# Patient Record
Sex: Female | Born: 1995
Health system: Southern US, Community
[De-identification: ages and names within clinical notes are randomized; demographics above are authoritative.]

## PROBLEM LIST (undated history)

## (undated) DIAGNOSIS — Z789 Other specified health status: Secondary | ICD-10-CM

## (undated) HISTORY — PX: TONSILLECTOMY: SUR1361

## (undated) HISTORY — DX: Other specified health status: Z78.9

---
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
## 2016-07-15 ENCOUNTER — Encounter (HOSPITAL_COMMUNITY): Payer: Self-pay | Admitting: Emergency Medicine

## 2016-07-15 ENCOUNTER — Ambulatory Visit (HOSPITAL_COMMUNITY)
Admission: EM | Admit: 2016-07-15 | Discharge: 2016-07-15 | Disposition: A | Discharge status: Home / Self Care | Payer: BLUE CROSS/BLUE SHIELD | Attending: Family Medicine | Admitting: Family Medicine

## 2016-07-15 ENCOUNTER — Ambulatory Visit (INDEPENDENT_AMBULATORY_CARE_PROVIDER_SITE_OTHER): Payer: BLUE CROSS/BLUE SHIELD

## 2016-07-15 DIAGNOSIS — S93402A Sprain of unspecified ligament of left ankle, initial encounter: Secondary | ICD-10-CM | POA: Diagnosis not present

## 2016-07-15 NOTE — ED Provider Notes (Signed)
CSN: [id number]     Arrival date & time 07/15/16  1200 History   First MD Initiated Contact with Patient 07/15/16 1416     Chief Complaint  Patient presents with  . Ankle Pain   (Consider location/radiation/quality/duration/timing/severity/associated sxs/prior Treatment) HPI History obtained from patient:  Pt presents with the cc of:  Left ankle injury Duration of symptoms: Since Sunday Treatment prior to arrival: Cold compresses elevation and rest Context: Patient was walking and tripped and twisted her left ankle. Other symptoms include: Abrasion to the right knee Pain score: 3 FAMILY HISTORY: None    History reviewed. No pertinent past medical history. History reviewed. No pertinent past surgical history. No family history on file. Social History  Substance Use Topics  . Smoking status: Never Smoker   . Smokeless tobacco: None  . Alcohol Use: No   OB History    No data available     Review of Systems  Denies: HEADACHE, NAUSEA, ABDOMINAL PAIN, CHEST PAIN, CONGESTION, DYSURIA, SHORTNESS OF BREATH  Allergies  Sulfa antibiotics  Home Medications   Prior to Admission medications   Not on File   Meds Ordered and Administered this Visit  Medications - No data to display  BP 133/71 mmHg  Pulse 78  Temp(Src) 98.8 F (37.1 C) (Oral)  Resp 16  SpO2 100%  LMP  (LMP Unknown) No data found.   Physical Exam NURSES NOTES AND VITAL SIGNS REVIEWED. CONSTITUTIONAL: Well developed, well nourished, no acute distress HEENT: normocephalic, atraumatic EYES: Conjunctiva normal NECK:normal ROM, supple, no adenopathy PULMONARY:No respiratory distress, normal effort ABDOMINAL: Soft, ND, NT BS+, No CVAT MUSCULOSKELETAL: Normal ROM of all extremities, Left ankle, swollen, tender lateral. Right Knee: abrasion with some redness. FROM SKIN: warm and dry without rash PSYCHIATRIC: Mood and affect, behavior are normal  ED Course  Procedures (including critical care time)  Labs  Review Labs Reviewed - No data to display  Imaging Review Dg Ankle Complete Left  07/15/2016  CLINICAL DATA:  Fall down 2 steps 2 days ago injuring left foot and ankle. EXAM: LEFT ANKLE COMPLETE - 3+ VIEW COMPARISON:  None. FINDINGS: Diffuse soft tissue swelling over the ankle. Ankle mortise is within normal. No acute fracture or dislocation visualized. IMPRESSION: No acute fracture. Electronically Signed   By: Elberta Fortisaniel  Boyle M.D.   On: 07/15/2016 14:38     Visual Acuity Review  Right Eye Distance:   Left Eye Distance:   Bilateral Distance:    Right Eye Near:   Left Eye Near:    Bilateral Near:       ASO is applied,  Discussed results of x-ray D/c home in stable condition   MDM   1. Left ankle sprain, initial encounter     Patient is reassured that there are no issues that require transfer to higher level of care at this time or additional tests. Patient is advised to continue home symptomatic treatment. Patient is advised that if there are new or worsening symptoms to attend the emergency department, contact primary care provider, or return to UC. Instructions of care provided discharged home in stable condition.    THIS NOTE WAS GENERATED USING A VOICE RECOGNITION SOFTWARE PROGRAM. ALL REASONABLE EFFORTS  WERE MADE TO PROOFREAD THIS DOCUMENT FOR ACCURACY.  I have verbally reviewed the discharge instructions with the patient. A printed AVS was given to the patient.  All questions were answered prior to discharge.      Tharon AquasFrank C Kateline Kinkade, PA 07/15/16 1451

## 2016-07-15 NOTE — Discharge Instructions (Signed)
Ankle Sprain °An ankle sprain is an injury to the strong, fibrous tissues (ligaments) that hold your ankle bones together.  °HOME CARE  °· Put ice on your ankle for 1-2 days or as told by your doctor. °· Put ice in a plastic bag. °· Place a towel between your skin and the bag. °· Leave the ice on for 15-20 minutes at a time, every 2 hours while you are awake. °· Only take medicine as told by your doctor. °· Raise (elevate) your injured ankle above the level of your heart as much as possible for 2-3 days. °· Use crutches if your doctor tells you to. Slowly put your own weight on the affected ankle. Use the crutches until you can walk without pain. °· If you have a plaster splint: °· Do not rest it on anything harder than a pillow for 24 hours. °· Do not put weight on it. °· Do not get it wet. °· Take it off to shower or bathe. °· If given, use an elastic wrap or support stocking for support. Take the wrap off if your toes lose feeling (numb), tingle, or turn cold or blue. °· If you have an air splint: °· Add or let out air to make it comfortable. °· Take it off at night and to shower and bathe. °· Wiggle your toes and move your ankle up and down often while you are wearing it. °GET HELP IF: °· You have rapidly increasing bruising or puffiness (swelling). °· Your toes feel very cold. °· You lose feeling in your foot. °· Your medicine does not help your pain. °GET HELP RIGHT AWAY IF:  °· Your toes lose feeling (numb) or turn blue. °· You have severe pain that is increasing. °MAKE SURE YOU:  °· Understand these instructions. °· Will watch your condition. °· Will get help right away if you are not doing well or get worse. °  °This information is not intended to replace advice given to you by your health care provider. Make sure you discuss any questions you have with your health care provider. °  °Document Released: 06/02/2008 Document Revised: 01/05/2015 Document Reviewed: 06/28/2012 °Elsevier Interactive Patient  Education ©2016 [employer] ° °Cryotherapy °Cryotherapy is when you put ice on your injury. Ice helps lessen pain and puffiness (swelling) after an injury. Ice works the best when you start using it in the first 24 to 48 hours after an injury. °HOME CARE °· Put a dry or damp towel between the ice pack and your skin. °· You may press gently on the ice pack. °· Leave the ice on for no more than 10 to 20 minutes at a time. °· Check your skin after 5 minutes to make sure your skin is okay. °· Rest at least 20 minutes between ice pack uses. °· Stop using ice when your skin loses feeling (numbness). °· Do not use ice on someone who cannot tell you when it hurts. This includes small children and people with memory problems (dementia). °GET HELP RIGHT AWAY IF: °· You have white spots on your skin. °· Your skin turns blue or pale. °· Your skin feels waxy or hard. °· Your puffiness gets worse. °MAKE SURE YOU:  °· Understand these instructions. °· Will watch your condition. °· Will get help right away if you are not doing well or get worse. °  °This information is not intended to replace advice given to you by your health care provider. Make sure you discuss any   questions you have with your health care provider. °  °Document Released: 06/02/2008 Document Revised: 03/08/2012 Document Reviewed: 08/07/2011 °Elsevier Interactive Patient Education ©2016 Elsevier Inc. ° °

## 2016-07-15 NOTE — ED Notes (Signed)
PT fell Sunday and injured her left foot. PT is ambulatory and can bear weight on affected foot.

## 2017-03-30 ENCOUNTER — Ambulatory Visit: Payer: BLUE CROSS/BLUE SHIELD | Admitting: Family Medicine

## 2017-04-14 ENCOUNTER — Ambulatory Visit (INDEPENDENT_AMBULATORY_CARE_PROVIDER_SITE_OTHER): Payer: BLUE CROSS/BLUE SHIELD | Admitting: Family Medicine

## 2017-04-14 ENCOUNTER — Encounter: Payer: Self-pay | Admitting: Family Medicine

## 2017-04-14 NOTE — Patient Instructions (Addendum)
-   TASTE PREFERENCES ARE LEARNED.  This means it will get easier to choose foods you know are good for you if you are exposed to them enough.    - For example, if you can start mixing diet or sweet tea with non-sweetened tea, your taste preferences will adjust to a less sweet tasting drink, and eventually you will actually prefer drinks that are less sweet.  This can take several months.  What is MOST important to achieving a change in taste preference is CONSISTENCY.    Headaches from not drinking caffeine:  You can still consume caffeine without the sugar or artificial sweeteners by reducing the sweetness of any drink.   Yesterday's dinner was virtually all carbohydrate.   Starchy (carb) foods: Bread, rice, pasta, potatoes, corn, cereal, grits, crackers, bagels, muffins, all baked goods.  (Fruits, milk, and yogurt also have carbohydrate, but most of these foods will not spike your blood sugar as most starchy foods will.)     Protein foods: Meat, fish, poultry, eggs, dairy foods, and beans such as pinto and kidney beans (beans also provide carbohydrate).   Goals:  1. Limit beverages to unsweetened drinks only (mostly water).   2. Obtain twice as many veg's as protein or carbohydrate foods for both lunch and dinner on at least 3 days per week. 3. Workout at least 30-60 min 3 X wk.    LASTLY:  Track your progress on each of these goals:  Use your phone app.    Bring your GOALS SHEET to follow-up appt in May.

## 2017-04-14 NOTE — Progress Notes (Signed)
Medical Nutrition Therapy:  Appt start time: 0900 end time:  1000.  Assessment:  Primary concerns today: Weight management.  PCP Damaris Hippo, MD; [facility]; [phone]  (Encounter notes requested; FAX: [phone])  Learning Readiness: Change in progress   Brenda Edwards was referred by Dr. Inda Castle for weight management.  She has started exercising last week.  She made a few dietary changes, but has not continued with all of these dietary changes, finding it hard, and noticing no weight changes after a couple weeks of efforts.  [name] said, "that food's nasty," but could only think of plain cooked oatmeal when asked to which foods she was referring.   [name] said she'd like to lose weight b/c she said she thinks she would be happier with herself if she could weigh ~250 lb.  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    She lives with her parents and two younger  brothers.    Usual eating pattern includes 2-3 meals and 1-2 snacks per day. Frequent foods and beverages include sweet tea (<1 c sug/gal = <1 T per 8 oz), diet green tea, water, o.j., chx, ~10 restaurant & takeout meals/wk.  Avoided foods include none.   Usual physical activity as of last week: 90 min personal trainer at gym, both resistance ex and aerobic 3 X wk; and hopes to also workout on her own at least another 2 X wk.   We reviewed yesterday's intake, and discussed its nutritional imbalance (mostly starch with some fat).  Also discussed important concept of taste preferences being learned rather than innate.   24-hr recall: (Up at 8:30 AM) B ( AM)-   --- Snk ( AM)-   --- L (12:30 PM)-  1 chsbgr, 2 c onion rings, 16 oz soda Snk 3 PM)-  1 large smoothie D (6:30 PM)-  1/4 c 1 c pintos, 1/4 c mac&chs, 1/4 c mashed potatoes, 1 pc corn bread, 2 c swt tea Snk ( PM)-  --- Typical day? Yes.    Progress Towards Goal(s):  In progress.   Nutritional Diagnosis:  [place]-3.3 Overweight/obesity As related  to energy imbalance.  As evidenced by BMI >50.    Intervention:  Nutrition education  Handouts given during visit include:  After-Visit Summary (AVS)  Demonstrated degree of understanding via:  Teach Back  Barriers to learning/adherence to lifestyle change: Longstanding poor eating behaviors.  Monitoring/Evaluation:  Dietary intake, exercise, and body weight in 4 week(s).

## 2017-05-11 ENCOUNTER — Ambulatory Visit: Payer: BLUE CROSS/BLUE SHIELD | Admitting: Family Medicine

## 2017-06-01 DIAGNOSIS — Z713 Dietary counseling and surveillance: Secondary | ICD-10-CM | POA: Diagnosis not present

## 2017-09-28 DIAGNOSIS — Z1389 Encounter for screening for other disorder: Secondary | ICD-10-CM | POA: Diagnosis not present

## 2017-09-28 DIAGNOSIS — Z130 Encounter for screening for diseases of the blood and blood-forming organs and certain disorders involving the immune mechanism: Secondary | ICD-10-CM | POA: Diagnosis not present

## 2017-09-28 DIAGNOSIS — Z01419 Encounter for gynecological examination (general) (routine) without abnormal findings: Secondary | ICD-10-CM | POA: Diagnosis not present

## 2017-10-06 DIAGNOSIS — Z23 Encounter for immunization: Secondary | ICD-10-CM | POA: Diagnosis not present

## 2018-01-11 DIAGNOSIS — H52202 Unspecified astigmatism, left eye: Secondary | ICD-10-CM | POA: Diagnosis not present

## 2018-01-11 DIAGNOSIS — Z3041 Encounter for surveillance of contraceptive pills: Secondary | ICD-10-CM | POA: Diagnosis not present

## 2018-01-11 DIAGNOSIS — H5202 Hypermetropia, left eye: Secondary | ICD-10-CM | POA: Diagnosis not present

## 2018-01-18 DIAGNOSIS — M545 Low back pain: Secondary | ICD-10-CM | POA: Diagnosis not present

## 2018-01-19 ENCOUNTER — Other Ambulatory Visit: Payer: Self-pay | Admitting: Physician Assistant

## 2018-01-19 ENCOUNTER — Ambulatory Visit
Admission: RE | Admit: 2018-01-19 | Discharge: 2018-01-19 | Disposition: A | Discharge status: Home / Self Care | Payer: BLUE CROSS/BLUE SHIELD | Source: Ambulatory Visit | Attending: Physician Assistant | Admitting: Physician Assistant

## 2018-01-19 DIAGNOSIS — M545 Low back pain: Secondary | ICD-10-CM

## 2018-02-01 DIAGNOSIS — M2560 Stiffness of unspecified joint, not elsewhere classified: Secondary | ICD-10-CM | POA: Diagnosis not present

## 2018-02-01 DIAGNOSIS — M545 Low back pain: Secondary | ICD-10-CM | POA: Diagnosis not present

## 2018-02-01 DIAGNOSIS — M6281 Muscle weakness (generalized): Secondary | ICD-10-CM | POA: Diagnosis not present

## 2018-02-08 DIAGNOSIS — M2560 Stiffness of unspecified joint, not elsewhere classified: Secondary | ICD-10-CM | POA: Diagnosis not present

## 2018-02-08 DIAGNOSIS — M545 Low back pain: Secondary | ICD-10-CM | POA: Diagnosis not present

## 2018-02-08 DIAGNOSIS — M6281 Muscle weakness (generalized): Secondary | ICD-10-CM | POA: Diagnosis not present
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
## 2018-05-02 DIAGNOSIS — J029 Acute pharyngitis, unspecified: Secondary | ICD-10-CM | POA: Diagnosis not present

## 2018-10-06 DIAGNOSIS — Z124 Encounter for screening for malignant neoplasm of cervix: Secondary | ICD-10-CM | POA: Diagnosis not present

## 2018-10-06 DIAGNOSIS — Z01419 Encounter for gynecological examination (general) (routine) without abnormal findings: Secondary | ICD-10-CM | POA: Diagnosis not present

## 2018-10-06 DIAGNOSIS — Z130 Encounter for screening for diseases of the blood and blood-forming organs and certain disorders involving the immune mechanism: Secondary | ICD-10-CM | POA: Diagnosis not present

## 2018-10-13 DIAGNOSIS — Z23 Encounter for immunization: Secondary | ICD-10-CM | POA: Diagnosis not present

## 2018-10-19 ENCOUNTER — Other Ambulatory Visit: Payer: Self-pay

## 2018-10-19 ENCOUNTER — Encounter (HOSPITAL_COMMUNITY): Payer: Self-pay | Admitting: Emergency Medicine

## 2018-10-19 ENCOUNTER — Emergency Department (HOSPITAL_COMMUNITY): Payer: Commercial Managed Care - PPO

## 2018-10-19 ENCOUNTER — Emergency Department (HOSPITAL_COMMUNITY)
Admission: EM | Admit: 2018-10-19 | Discharge: 2018-10-19 | Disposition: A | Discharge status: Home / Self Care | Payer: Commercial Managed Care - PPO | Attending: Emergency Medicine | Admitting: Emergency Medicine

## 2018-10-19 DIAGNOSIS — F17210 Nicotine dependence, cigarettes, uncomplicated: Secondary | ICD-10-CM | POA: Insufficient documentation

## 2018-10-19 DIAGNOSIS — R51 Headache: Secondary | ICD-10-CM | POA: Insufficient documentation

## 2018-10-19 DIAGNOSIS — Z79899 Other long term (current) drug therapy: Secondary | ICD-10-CM | POA: Insufficient documentation

## 2018-10-19 MED ORDER — METHOCARBAMOL 500 MG PO TABS: 500.0000 mg | ORAL_TABLET | Freq: Two times a day (BID) | ORAL | 0 refills | Status: DC

## 2018-10-19 MED ORDER — ACETAMINOPHEN 325 MG PO TABS: 650.0000 mg | ORAL_TABLET | Freq: Four times a day (QID) | ORAL | 0 refills | Status: DC | PRN
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
## 2018-10-19 NOTE — ED Notes (Signed)
Declined W/C at D/C and was escorted to lobby by RN. 
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
## 2020-11-07 ENCOUNTER — Inpatient Hospital Stay (HOSPITAL_COMMUNITY): Admit: 2020-11-07 | Payer: 59 | Admitting: Obstetrics and Gynecology

## 2020-12-29 NOTE — L&D Delivery Note (Signed)
Delivery Note After epidural, pt labored quickly to complete with urge to push. She pushed for a few tries and at 8:10 PM a viable female was delivered via  (Presentation: Left Occiput Anterior).  APGAR: 8, 9; weight pending.  Loose nuchal x 1 was reduced over vertex at perineum. Anterior and posterior shoulders delivered with the next push; body followed.    Placenta status: Spontaneous, Intact.[name]  Cord: 3 vessels with the following complications: None.  Cord pH: n/a  Anesthesia:  Epidural Episiotomy: None Lacerations: 1st degree;Vaginal;Labial Suture Repair: 2.0 vicryl Est. Blood Loss (mL): 90  Mom to postpartum.  Baby to Couplet care / Skin to Skin.  Cathrine Muster 01/24/2021, 8:27 PM

## 2021-01-17 ENCOUNTER — Encounter (HOSPITAL_COMMUNITY): Payer: Self-pay | Admitting: *Deleted

## 2021-01-17 ENCOUNTER — Telehealth (HOSPITAL_COMMUNITY): Payer: Self-pay | Admitting: *Deleted

## 2021-01-17 NOTE — Telephone Encounter (Signed)
Preadmission screen  

## 2021-01-24 ENCOUNTER — Inpatient Hospital Stay (HOSPITAL_COMMUNITY): Payer: 59 | Admitting: Anesthesiology

## 2021-01-24 ENCOUNTER — Encounter (HOSPITAL_COMMUNITY): Payer: Self-pay | Admitting: Obstetrics and Gynecology

## 2021-01-24 ENCOUNTER — Other Ambulatory Visit: Payer: Self-pay

## 2021-01-24 ENCOUNTER — Inpatient Hospital Stay (HOSPITAL_COMMUNITY)
Admission: AD | Admit: 2021-01-24 | Discharge: 2021-01-26 | DRG: 807 | Disposition: A | Discharge status: Home / Self Care | Payer: 59 | Attending: Obstetrics and Gynecology | Admitting: Obstetrics and Gynecology

## 2021-01-24 DIAGNOSIS — O26893 Other specified pregnancy related conditions, third trimester: Secondary | ICD-10-CM | POA: Diagnosis present

## 2021-01-24 DIAGNOSIS — Z3A39 39 weeks gestation of pregnancy: Secondary | ICD-10-CM | POA: Diagnosis not present

## 2021-01-24 DIAGNOSIS — O4290 Premature rupture of membranes, unspecified as to length of time between rupture and onset of labor, unspecified weeks of gestation: Secondary | ICD-10-CM | POA: Diagnosis present

## 2021-01-24 DIAGNOSIS — Z87891 Personal history of nicotine dependence: Secondary | ICD-10-CM | POA: Diagnosis not present

## 2021-01-24 DIAGNOSIS — Z8616 Personal history of COVID-19: Secondary | ICD-10-CM

## 2021-01-24 DIAGNOSIS — O99214 Obesity complicating childbirth: Secondary | ICD-10-CM | POA: Diagnosis present

## 2021-01-24 LAB — CBC
HCT: 36.1 % (ref 36.0–46.0)
Hemoglobin: 12.5 g/dL (ref 12.0–15.0)
MCH: 30.9 pg (ref 26.0–34.0)
MCHC: 34.6 g/dL (ref 30.0–36.0)
MCV: 89.4 fL (ref 80.0–100.0)
Platelets: 246 10*3/uL (ref 150–400)
RBC: 4.04 MIL/uL (ref 3.87–5.11)
RDW: 13.2 % (ref 11.5–15.5)
WBC: 7.7 10*3/uL (ref 4.0–10.5)
nRBC: 0 % (ref 0.0–0.2)

## 2021-01-24 LAB — TYPE AND SCREEN
ABO/RH(D): A POS
Antibody Screen: NEGATIVE

## 2021-01-24 MED ORDER — EPHEDRINE 5 MG/ML INJ: 10.0000 mg | INTRAVENOUS | Status: DC | PRN

## 2021-01-24 MED ORDER — LACTATED RINGERS IV SOLN
500.0000 mL | Freq: Once | INTRAVENOUS | Status: AC
  Administered 2021-01-24: 500 mL via INTRAVENOUS

## 2021-01-24 MED ORDER — DIBUCAINE (PERIANAL) 1 % EX OINT: 1.0000 "application " | TOPICAL_OINTMENT | CUTANEOUS | Status: DC | PRN

## 2021-01-24 MED ORDER — ZOLPIDEM TARTRATE 5 MG PO TABS
5.0000 mg | ORAL_TABLET | Freq: Every evening | ORAL | Status: DC | PRN
Start: 2021-01-24 — End: 2021-01-26

## 2021-01-24 MED ORDER — FENTANYL-BUPIVACAINE-NACL 0.5-0.125-0.9 MG/250ML-% EP SOLN
12.0000 mL/h | EPIDURAL | Status: DC | PRN
  Administered 2021-01-24: 12 mL/h via EPIDURAL
  Filled 2021-01-24: qty 250

## 2021-01-24 MED ORDER — OXYCODONE-ACETAMINOPHEN 5-325 MG PO TABS: 2.0000 | ORAL_TABLET | ORAL | Status: DC | PRN

## 2021-01-24 MED ORDER — ACETAMINOPHEN 325 MG PO TABS: 650.0000 mg | ORAL_TABLET | ORAL | Status: DC | PRN

## 2021-01-24 MED ORDER — LIDOCAINE HCL (PF) 1 % IJ SOLN: 30.0000 mL | INTRAMUSCULAR | Status: DC | PRN

## 2021-01-24 MED ORDER — LIDOCAINE HCL (PF) 1 % IJ SOLN
INTRAMUSCULAR | Status: DC | PRN
  Administered 2021-01-24: 5 mL via EPIDURAL
  Administered 2021-01-24: 3 mL via EPIDURAL
  Administered 2021-01-24: 2 mL via EPIDURAL

## 2021-01-24 MED ORDER — ONDANSETRON HCL 4 MG/2ML IJ SOLN: 4.0000 mg | INTRAMUSCULAR | Status: DC | PRN

## 2021-01-24 MED ORDER — PRENATAL MULTIVITAMIN CH
1.0000 | ORAL_TABLET | Freq: Every day | ORAL | Status: DC
  Administered 2021-01-25 – 2021-01-26 (×2): 1 via ORAL
  Filled 2021-01-24 (×2): qty 1

## 2021-01-24 MED ORDER — DIPHENHYDRAMINE HCL 25 MG PO CAPS: 25.0000 mg | ORAL_CAPSULE | Freq: Four times a day (QID) | ORAL | Status: DC | PRN

## 2021-01-24 MED ORDER — ONDANSETRON HCL 4 MG PO TABS: 4.0000 mg | ORAL_TABLET | ORAL | Status: DC | PRN

## 2021-01-24 MED ORDER — OXYCODONE HCL 5 MG PO TABS: 10.0000 mg | ORAL_TABLET | ORAL | Status: DC | PRN

## 2021-01-24 MED ORDER — BENZOCAINE-MENTHOL 20-0.5 % EX AERO
1.0000 "application " | INHALATION_SPRAY | CUTANEOUS | Status: DC | PRN
  Administered 2021-01-25 – 2021-01-26 (×4): 1 via TOPICAL
  Filled 2021-01-24 (×4): qty 56

## 2021-01-24 MED ORDER — COCONUT OIL OIL: 1.0000 "application " | TOPICAL_OIL | Status: DC | PRN

## 2021-01-24 MED ORDER — SENNOSIDES-DOCUSATE SODIUM 8.6-50 MG PO TABS
2.0000 | ORAL_TABLET | Freq: Every day | ORAL | Status: DC
  Administered 2021-01-25 – 2021-01-26 (×2): 2 via ORAL
  Filled 2021-01-24 (×2): qty 2

## 2021-01-24 MED ORDER — IBUPROFEN 600 MG PO TABS
600.0000 mg | ORAL_TABLET | Freq: Four times a day (QID) | ORAL | Status: DC
  Administered 2021-01-25 – 2021-01-26 (×6): 600 mg via ORAL
  Filled 2021-01-24 (×6): qty 1

## 2021-01-24 MED ORDER — BUTORPHANOL TARTRATE 1 MG/ML IJ SOLN
1.0000 mg | INTRAMUSCULAR | Status: DC | PRN
  Administered 2021-01-24: 1 mg via INTRAVENOUS
  Filled 2021-01-24: qty 1

## 2021-01-24 MED ORDER — WITCH HAZEL-GLYCERIN EX PADS: 1.0000 "application " | MEDICATED_PAD | CUTANEOUS | Status: DC | PRN

## 2021-01-24 MED ORDER — DIPHENHYDRAMINE HCL 50 MG/ML IJ SOLN: 12.5000 mg | INTRAMUSCULAR | Status: DC | PRN

## 2021-01-24 MED ORDER — ONDANSETRON HCL 4 MG/2ML IJ SOLN: 4.0000 mg | Freq: Four times a day (QID) | INTRAMUSCULAR | Status: DC | PRN

## 2021-01-24 MED ORDER — SIMETHICONE 80 MG PO CHEW: 80.0000 mg | CHEWABLE_TABLET | ORAL | Status: DC | PRN

## 2021-01-24 MED ORDER — LACTATED RINGERS IV SOLN: 500.0000 mL | INTRAVENOUS | Status: DC | PRN

## 2021-01-24 MED ORDER — PHENYLEPHRINE 40 MCG/ML (10ML) SYRINGE FOR IV PUSH (FOR BLOOD PRESSURE SUPPORT): 80.0000 ug | PREFILLED_SYRINGE | INTRAVENOUS | Status: DC | PRN

## 2021-01-24 MED ORDER — OXYTOCIN BOLUS FROM INFUSION
333.0000 mL | Freq: Once | INTRAVENOUS | Status: AC
  Administered 2021-01-24: 333 mL via INTRAVENOUS

## 2021-01-24 MED ORDER — OXYTOCIN-SODIUM CHLORIDE 30-0.9 UT/500ML-% IV SOLN
1.0000 m[IU]/min | INTRAVENOUS | Status: DC
  Administered 2021-01-24: 2 m[IU]/min via INTRAVENOUS
  Filled 2021-01-24: qty 500

## 2021-01-24 MED ORDER — TERBUTALINE SULFATE 1 MG/ML IJ SOLN: 0.2500 mg | Freq: Once | INTRAMUSCULAR | Status: DC | PRN

## 2021-01-24 MED ORDER — OXYCODONE-ACETAMINOPHEN 5-325 MG PO TABS: 1.0000 | ORAL_TABLET | ORAL | Status: DC | PRN

## 2021-01-24 MED ORDER — OXYCODONE HCL 5 MG PO TABS: 5.0000 mg | ORAL_TABLET | ORAL | Status: DC | PRN

## 2021-01-24 MED ORDER — LACTATED RINGERS IV SOLN: INTRAVENOUS | Status: DC

## 2021-01-24 MED ORDER — OXYTOCIN-SODIUM CHLORIDE 30-0.9 UT/500ML-% IV SOLN
2.5000 [IU]/h | INTRAVENOUS | Status: DC
  Administered 2021-01-24: 2.5 [IU]/h via INTRAVENOUS

## 2021-01-24 MED ORDER — SOD CITRATE-CITRIC ACID 500-334 MG/5ML PO SOLN: 30.0000 mL | ORAL | Status: DC | PRN

## 2021-01-24 MED ORDER — TETANUS-DIPHTH-ACELL PERTUSSIS 5-2.5-18.5 LF-MCG/0.5 IM SUSY: 0.5000 mL | PREFILLED_SYRINGE | Freq: Once | INTRAMUSCULAR | Status: DC

## 2021-01-24 NOTE — Anesthesia Preprocedure Evaluation (Signed)
Anesthesia Evaluation  Patient identified by MRN, date of birth, ID band Patient awake    Reviewed: Allergy & Precautions, Patient's Chart, lab work & pertinent test results  Airway Mallampati: II       Dental   Pulmonary former smoker,    Pulmonary exam normal        Cardiovascular negative cardio ROS Normal cardiovascular exam     Neuro/Psych negative neurological ROS     GI/Hepatic negative GI ROS, Neg liver ROS,   Endo/Other  Morbid obesity  Renal/GU negative Renal ROS     Musculoskeletal   Abdominal   Peds  Hematology negative hematology ROS (+)   Anesthesia Other Findings   Reproductive/Obstetrics (+) Pregnancy                             Anesthesia Physical Anesthesia Plan  ASA: III  Anesthesia Plan: Epidural   Post-op Pain Management:    Induction:   PONV Risk Score and Plan: Treatment may vary due to age or medical condition  Airway Management Planned: Natural Airway  Additional Equipment:   Intra-op Plan:   Post-operative Plan:   Informed Consent: I have reviewed the patients History and Physical, chart, labs and discussed the procedure including the risks, benefits and alternatives for the proposed anesthesia with the patient or authorized representative who has indicated his/her understanding and acceptance.       Plan Discussed with:   Anesthesia Plan Comments:         Anesthesia Quick Evaluation

## 2021-01-24 NOTE — Plan of Care (Signed)

## 2021-01-24 NOTE — Progress Notes (Signed)
Patient ID: Brenda Edwards, female   DOB: 1996/10/08, 25 y.o.   MRN: [id number] Pt reports increasing discomfort with contractions. Requesting epidural. +FMs VSS GEN - mild discomfort, no distress EFM - 130, cat 1 TOCO-  ctxs q 1-55mins SVE - 5-6cm dilated  A/P: Progressing well on 14u pitocin        Await epidural         Continue with expectant mgmt

## 2021-01-24 NOTE — Anesthesia Procedure Notes (Signed)
Epidural Patient location during procedure: OB Start time: 01/24/2021 6:45 PM End time: 01/24/2021 6:55 PM  Staffing Anesthesiologist: Marcene Duos, MD Performed: anesthesiologist   Preanesthetic Checklist Completed: patient identified, IV checked, site marked, risks and benefits discussed, surgical consent, monitors and equipment checked, pre-op evaluation and timeout performed  Epidural Patient position: sitting Prep: DuraPrep and site prepped and draped Patient monitoring: continuous pulse ox and blood pressure Approach: midline Location: L3-L4 Injection technique: LOR air  Needle:  Needle type: Tuohy  Needle gauge: 17 G Needle length: 9 cm and 9 Needle insertion depth: 8 cm Catheter type: closed end flexible Catheter size: 19 Gauge Catheter at skin depth: 15 (14-->15cm when sat up) cm Test dose: negative  Assessment Events: blood not aspirated, injection not painful, no injection resistance, no paresthesia and negative IV test

## 2021-01-24 NOTE — H&P (Signed)
Brenda Edwards is a 25 y.o.prime  female presenting for SROM that occurred when woke up today. She was seen in the office and fern and pooling confirmed. Pt is dated per 11week [place]. Her prenatal care has been complicated by obesity. She is GBS neg. She declined genetic and carrier screening. She had covid infection 12/13/20. . OB History    Gravida  1   Para      Term      Preterm      AB      Living        SAB      IAB      Ectopic      Multiple      Live Births             Past Medical History:  Diagnosis Date  . Medical history non-contributory    Past Surgical History:  Procedure Laterality Date  . TONSILLECTOMY     Family History: family history is not on file. Social History:  reports that she quit smoking about 10 months ago. Her smoking use included cigarettes. She has a 0.01 pack-year smoking history. She has never used smokeless tobacco. She reports previous alcohol use. She reports that she does not use drugs.     Maternal Diabetes: No Genetic Screening: Declined Maternal Ultrasounds/Referrals: Normal Fetal Ultrasounds or other Referrals:  None Maternal Substance Abuse:  No Significant Maternal Medications:  None Significant Maternal Lab Results:  Group B Strep negative Other Comments:  None  Review of Systems  Constitutional: Negative for activity change, appetite change and fatigue.  Eyes: Negative for photophobia and visual disturbance.  Respiratory: Negative for chest tightness and shortness of breath.   Cardiovascular: Negative for chest pain, palpitations and leg swelling.  Gastrointestinal: Negative for abdominal pain.  Genitourinary: Negative for menstrual problem.  Musculoskeletal: Negative for back pain.  Neurological: Negative for light-headedness and headaches.  Psychiatric/Behavioral: The patient is not nervous/anxious.    Maternal Medical History:  Reason for admission: Rupture of membranes.   Contractions: Frequency: rare.    Perceived severity is mild.    Fetal activity: Perceived fetal activity is normal.    Prenatal complications: no prenatal complications Prenatal Complications - Diabetes: none.      There were no vitals taken for this visit. Maternal Exam:  Uterine Assessment: Contraction frequency is irregular.   Abdomen: Estimated fetal weight is 8-9lbs.   Fetal presentation: vertex  Introitus: Normal vulva. Vulva is negative for condylomata and lesion.  Normal vagina.  Vagina is negative for condylomata.  Amniotic fluid character: clear.  Pelvis: questionable for delivery.   Cervix: Cervix evaluated by digital exam.     Fetal Exam Fetal Monitor Review: Baseline rate: 140.  Variability: moderate (6-25 bpm).   Pattern: accelerations present and no decelerations.    Fetal State Assessment: Category I - tracings are normal.     Physical Exam Vitals and nursing note reviewed.  Cardiovascular:     Pulses: Normal pulses.  Pulmonary:     Effort: Pulmonary effort is normal.  Genitourinary:    General: Normal vulva.  Vulva is no lesion.  Musculoskeletal:        General: Normal range of motion.     Cervical back: Normal range of motion.  Skin:    General: Skin is warm.     Capillary Refill: Capillary refill takes 2 to 3 seconds.  Neurological:     General: No focal deficit present.     Mental  Status: She is oriented to person, place, and time. Mental status is at baseline.  Psychiatric:        Mood and Affect: Mood normal.        Behavior: Behavior normal.        Thought Content: Thought content normal.        Judgment: Judgment normal.     Prenatal labs: ABO, Rh:   Antibody:   Rubella:   RPR:    HBsAg:    HIV:    GBS:     Assessment/Plan: 24yo prime at 33 1/7wks with SROM in latent labor -Admit -GBS neg -Covid screen per protocol -Pain control prn -Augment with pitocin  -EFW 8-9 lbs ; plan for svd    Chawn Spraggins W Cordney Barstow 01/24/2021, 1:43 PM

## 2021-01-25 LAB — CBC
HCT: 38.8 % (ref 36.0–46.0)
Hemoglobin: 12.9 g/dL (ref 12.0–15.0)
MCH: 29.7 pg (ref 26.0–34.0)
MCHC: 33.2 g/dL (ref 30.0–36.0)
MCV: 89.2 fL (ref 80.0–100.0)
Platelets: 257 10*3/uL (ref 150–400)
RBC: 4.35 MIL/uL (ref 3.87–5.11)
RDW: 13.1 % (ref 11.5–15.5)
WBC: 12.8 10*3/uL — ABNORMAL HIGH (ref 4.0–10.5)
nRBC: 0 % (ref 0.0–0.2)

## 2021-01-25 LAB — RPR: RPR Ser Ql: NONREACTIVE

## 2021-01-25 NOTE — Lactation Note (Signed)
This note was copied from a baby's chart. Lactation Consultation Note  Patient Name: Brenda Edwards [id number] Date: 01/25/2021 Reason for consult: Initial assessment;Primapara;1st time breastfeeding;Term Age:24 hours  Initial visit to 4 hours old infant of P1 mother. Infant is latched to right breast, cradle position upon arrival. Infant seems to be popping on and off breast. Offered assistance with latch. Mother agreed. Transitioned to modified cradle, right breast. Infant continues to pop on and off breast while crying. Talked to mother about hand expression, demonstrated technique and able to collect ~2 mL in a spoon. Showed how to spoon-feed colostrum to baby. Baby continued showing hunger cues during visit. Assisted with hand expression, collected ~40mL and spoonfed. Infant seems agreeable to latch. Assisted mother with a football latch to right breast. Provided support with pillows.  Infant was able to latch successfully after a couple of attempts. Noted suckling and swallowing. Mother massaged and compressed breast. LC provided colostrum vials and spoons. Reviewed milk storage.   Reviewed with mother average size of a NB stomach. Encourage to follow babies' hunger and fullness cues. Reviewed importance to offer the breast 8 to 12 times in a 24-hour period for proper stimulation and to establish good milk supply. Reviewed colostrum benefits for baby. Reviewed breastfeeding basics. Discussed milk coming to volume. Reviewed newborn behavior and expectations with parents during first days of life. Promoted maternal rest, hydration and food intake. Encouraged to contact [facility] for support when ready to breastfeed baby and recommended to request help for questions or concerns.    All questions answered at this time.    Maternal Data Formula Feeding for Exclusion: No Has patient been taught Hand Expression?: Yes Does the patient have breastfeeding experience prior to this delivery?:  No  Feeding Feeding Type: Breast Fed  LATCH Score Latch: Grasps breast easily, tongue down, lips flanged, rhythmical sucking.  Audible Swallowing: Spontaneous and intermittent  Type of Nipple: Everted at rest and after stimulation  Comfort (Breast/Nipple): Soft / non-tender  Hold (Positioning): Assistance needed to correctly position infant at breast and maintain latch.  LATCH Score: 9  Interventions Interventions: Breast feeding basics reviewed;Assisted with latch;Skin to skin;Breast massage;Hand express;Adjust position;Position options;Support pillows;Expressed milk  Lactation Tools Discussed/Used WIC Program: No   Consult Status Consult Status: Follow-up Date: 01/26/21 Follow-up type: In-patient    Deboraha Goar A Higuera Ancidey 01/25/2021, 12:11 AM

## 2021-01-25 NOTE — Progress Notes (Signed)
Post Partum Day 1 Subjective: no complaints, up ad lib and tolerating PO  Objective: Blood pressure 129/77, pulse 79, temperature 98.8 F (37.1 C), temperature source Oral, resp. rate 16, height 5\' 6"  (1.676 m), weight 124.2 kg, SpO2 99 %, unknown if currently breastfeeding.  Physical Exam:  General: alert and cooperative Lochia: appropriate Uterine Fundus: firm   Recent Labs    01/24/21 1404 01/25/21 0533  HGB 12.5 12.9  HCT 36.1 38.8    Assessment/Plan: Plan for discharge tomorrow   LOS: 1 day   01/27/21 01/25/2021, 10:21 AM

## 2021-01-25 NOTE — Anesthesia Postprocedure Evaluation (Signed)
Anesthesia Post Note  Patient: Brenda Edwards  Procedure(s) Performed: AN AD HOC LABOR EPIDURAL     Patient location during evaluation: Mother Baby Anesthesia Type: Epidural Level of consciousness: awake, awake and alert and oriented Pain management: pain level controlled Vital Signs Assessment: post-procedure vital signs reviewed and stable Respiratory status: spontaneous breathing, nonlabored ventilation and respiratory function stable Cardiovascular status: stable Postop Assessment: no headache, no backache, patient able to bend at knees, no apparent nausea or vomiting, adequate PO intake and able to ambulate Anesthetic complications: no   No complications documented.  Last Vitals:  Vitals:   01/25/21 0330 01/25/21 0730  BP: 122/71 129/77  Pulse: 81 79  Resp: 18 16  Temp: 37 C 37.1 C  SpO2:  99%    Last Pain:  Vitals:   01/25/21 0730  TempSrc: Oral  PainSc: 0-No pain   Pain Goal: Patients Stated Pain Goal: 8 (01/24/21 1500)                 Aragorn Recker

## 2021-01-26 ENCOUNTER — Other Ambulatory Visit (HOSPITAL_COMMUNITY): Payer: 59

## 2021-01-26 MED ORDER — ACETAMINOPHEN 325 MG PO TABS: 650.0000 mg | ORAL_TABLET | ORAL | 1 refills | Status: AC | PRN

## 2021-01-26 MED ORDER — IBUPROFEN 600 MG PO TABS: 600.0000 mg | ORAL_TABLET | Freq: Four times a day (QID) | ORAL | 0 refills | Status: AC

## 2021-01-26 NOTE — Lactation Note (Signed)
This note was copied from a baby's chart. Lactation Consultation Note  Patient Name: Brenda Edwards [id number] Date: 01/26/2021 Reason for consult: Follow-up assessment;Nipple pain/trauma;Primapara;1st time breastfeeding;Term;Infant weight loss;Other (Comment) (5 % weight loss,)  At the start of the [facility] consult - time 1145 . Baby asleep.  Per mom breast feeding is going well except my left nipple is very sore and cracked.  LC reviewed and updated the doc flow sheets with mom and dad from the  I/O sheet. WNL for age - feedings and voids and stools.  LC reviewed breast feeding basics , answered all moms breast feeding questions.  Baby awake and rooting, placed STS on the right breast due to the soreness on the left nipple base. LC worked on proper positioning and latching.  Increased swallows and comfort when baby got in to a good feeding pattern.  See doc flow sheet for assessment of the nipples and breakdown on the base of the left nipple and tx.  Engorgement prevention and tx reviewed .  Per mom has a DEBP - Medela at home.  LC recommended if by 4 days sore nipples have not improved to call for [facility] O/[facility] appt.  Both mom and dad receptive to teaching and review and expressed appreciation For LC's time and assistance.  LC praised mom for her breast feeding efforts and dad for his support.   Maternal Data Has patient been taught Hand Expression?: Yes (LC reviewed with mom when assisting with latch)  Feeding Feeding Type: Breast Fed  LATCH Score Latch: Grasps breast easily, tongue down, lips flanged, rhythmical sucking.  Audible Swallowing: Spontaneous and intermittent  Type of Nipple: Everted at rest and after stimulation  Comfort (Breast/Nipple): Filling, red/small blisters or bruises, mild/mod discomfort  Hold (Positioning): Assistance needed to correctly position infant at breast and maintain latch. (worked on positioning and depth at the breast and per mom more  comfortable)  LATCH Score: 8  Interventions Interventions: Breast feeding basics reviewed;Assisted with latch;Skin to skin;Breast massage;Hand express;Pre-pump if needed;Reverse pressure;Breast compression;Adjust position;Support pillows;Position options;Shells;Comfort gels;Hand pump  Lactation Tools Discussed/Used Tools: Shells;Pump;Flanges;Comfort gels Flange Size: 24;27 Shell Type: Inverted Breast pump type: Manual Pump Education: Milk Storage;Setup, frequency, and cleaning Initiated by:: MAI Date initiated:: 01/26/21   Consult Status Consult Status: Complete Date: 01/26/21    Kathrin Greathouse 01/26/2021, 1:47 PM

## 2021-01-26 NOTE — Progress Notes (Signed)
Post Partum Day 2 Subjective: no complaints, up ad lib and tolerating PO  Objective: Blood pressure 135/81, pulse 80, temperature 98.8 F (37.1 C), temperature source Oral, resp. rate 18, height 5\' 6"  (1.676 m), weight 124.2 kg, SpO2 99 %, unknown if currently breastfeeding.  Physical Exam:  General: alert and cooperative Lochia: appropriate Uterine Fundus: firm   Recent Labs    01/24/21 1404 01/25/21 0533  HGB 12.5 12.9  HCT 36.1 38.8    Assessment/Plan: Discharge home   LOS: 2 days   01/27/21 01/26/2021, 7:31 AM

## 2021-01-26 NOTE — Discharge Summary (Signed)
Postpartum Discharge Summary       Patient Name: Brenda Edwards DOB: [birth year] MRN: [id number]  Date of admission: 01/24/2021 Delivery date:01/24/2021  Delivering provider: Pryor Ochoa [facility]  Date of discharge: 01/26/2021  Admitting diagnosis: Delayed delivery after SROM (spontaneous rupture of membranes) [O42.90] Intrauterine pregnancy: [redacted]w[redacted]d     Secondary diagnosis:  Active Problems:   Delayed delivery after SROM (spontaneous rupture of membranes)   SVD (spontaneous vaginal delivery)  Additional problems: obesity    Discharge diagnosis: Term Pregnancy Delivered                                              Post partum procedures:none Augmentation: Pitocin Complications: None  Hospital course: Onset of Labor With Vaginal Delivery      25 y.o. yo G1P1001 at [redacted]w[redacted]d was admitted in Latent Labor on 01/24/2021. Patient had an uncomplicated labor course as follows:  Membrane Rupture Time/Date: 10:00 AM ,01/24/2021   Delivery Method:Vaginal, Spontaneous  Episiotomy: None  Lacerations:  1st degree;Vaginal;Labial  Patient had an uncomplicated postpartum course.  She is ambulating, tolerating a regular diet, passing flatus, and urinating well. Patient is discharged home in stable condition on 01/26/21.  Newborn Data: Birth date:01/24/2021  Birth time:8:10 PM  Gender:Female  Living status:Living  Apgars:8 ,9  Weight:3050 g   Magnesium Sulfate received: No BMZ received: No Rhophylac:No   Physical exam  Vitals:   01/25/21 1459 01/25/21 1610 01/25/21 2100 01/26/21 0548  BP: 139/83 140/82 (!) 147/81 135/81  Pulse: 79 80 83 80  Resp: 18  20 18   Temp: 99 F (37.2 C)  97.7 F (36.5 C) 98.8 F (37.1 C)  TempSrc: Oral  Oral Oral  SpO2:   100% 99%  Weight:      Height:       General: alert and cooperative Lochia: appropriate Uterine Fundus: firm  Labs: Lab Results  Component Value Date   WBC 12.8 (H) 01/25/2021   HGB 12.9 01/25/2021   HCT 38.8 01/25/2021   MCV  89.2 01/25/2021   PLT 257 01/25/2021   No flowsheet data found. Edinburgh Score: Edinburgh Postnatal Depression Scale Screening Tool 01/25/2021  I have been able to laugh and see the funny side of things. 0  I have looked forward with enjoyment to things. 0  I have blamed myself unnecessarily when things went wrong. 2  I have been anxious or worried for no good reason. 3  I have felt scared or panicky for no good reason. 0  Things have been getting on top of me. 3  I have been so unhappy that I have had difficulty sleeping. 0  I have felt sad or miserable. 0  I have been so unhappy that I have been crying. 1  The thought of harming myself has occurred to me. 0  Edinburgh Postnatal Depression Scale Total 9     After visit meds:  Allergies as of 01/26/2021      Reactions   Sulfa Antibiotics Rash      Medication List    STOP taking these medications   methocarbamol 500 MG tablet Commonly known as: ROBAXIN   norethindrone-ethinyl estradiol 1-20 MG-MCG tablet Commonly known as: LOESTRIN FE     TAKE these medications   acetaminophen 325 MG tablet Commonly known as: Tylenol Take 2 tablets (650 mg total) by mouth every 4 (four)  hours as needed (for pain scale < 4). What changed:   when to take this  reasons to take this   cetirizine 10 MG tablet Commonly known as: ZYRTEC Take 10 mg by mouth daily.   FLUoxetine 20 MG/5ML solution Commonly known as: PROZAC Take by mouth daily.   ibuprofen 600 MG tablet Commonly known as: ADVIL Take 1 tablet (600 mg total) by mouth every 6 (six) hours.   multivitamin-prenatal 27-0.8 MG Tabs tablet Take 1 tablet by mouth daily at 12 noon.   pantoprazole 40 MG tablet Commonly known as: PROTONIX Take 40 mg by mouth daily.        Discharge home in stable condition Infant Feeding: Breast Infant Disposition:home with mother Discharge instruction: per After Visit Summary and Postpartum booklet. Activity: Advance as tolerated.  Pelvic rest for 6 weeks.  Diet: routine diet Future Appointments:No future appointments. Follow up Visit:  Follow-up Information    Edwinna Areola, DO. Schedule an appointment as soon as possible for a visit in 6 week(s).   Specialty: Obstetrics and Gynecology Why: postpartum Contact information: [address] [place] [address] [place] [address] [phone]                Please schedule this patient for a In person postpartum visit in 6 weeks with the following provider: MD.  Delivery mode:  Vaginal, Spontaneous  Anticipated Birth Control:  Nexplanon   01/26/2021 Oliver Pila, MD

## 2021-01-28 ENCOUNTER — Inpatient Hospital Stay (HOSPITAL_COMMUNITY): Admission: AD | Admit: 2021-01-28 | Payer: 59 | Source: Home / Self Care | Admitting: Obstetrics and Gynecology

## 2021-01-28 ENCOUNTER — Inpatient Hospital Stay (HOSPITAL_COMMUNITY): Payer: 59
